# Patient Record
Sex: Female | Born: 1975 | Race: White | Hispanic: No | Marital: Married | Smoking: Never smoker
Health system: Southern US, Community
[De-identification: ages and names within clinical notes are randomized; demographics above are authoritative.]

## PROBLEM LIST (undated history)

## (undated) DIAGNOSIS — R569 Unspecified convulsions: Secondary | ICD-10-CM

## (undated) HISTORY — DX: Unspecified convulsions: R56.9

## (undated) HISTORY — PX: OTHER SURGICAL HISTORY: SHX169

---

## 2003-06-16 ENCOUNTER — Other Ambulatory Visit: Admission: RE | Admit: 2003-06-16 | Discharge: 2003-06-16 | Payer: Self-pay | Admitting: Obstetrics & Gynecology

## 2004-07-04 ENCOUNTER — Other Ambulatory Visit: Admission: RE | Admit: 2004-07-04 | Discharge: 2004-07-04 | Payer: Self-pay | Admitting: Obstetrics and Gynecology

## 2006-01-04 ENCOUNTER — Other Ambulatory Visit: Admission: RE | Admit: 2006-01-04 | Discharge: 2006-01-04 | Payer: Self-pay | Admitting: Obstetrics and Gynecology

## 2006-07-19 ENCOUNTER — Inpatient Hospital Stay (HOSPITAL_COMMUNITY): Admission: AD | Admit: 2006-07-19 | Discharge: 2006-07-22 | Payer: Self-pay | Admitting: Obstetrics and Gynecology

## 2006-07-27 ENCOUNTER — Inpatient Hospital Stay (HOSPITAL_COMMUNITY): Admission: AD | Admit: 2006-07-27 | Discharge: 2006-07-27 | Payer: Self-pay | Admitting: Obstetrics and Gynecology

## 2006-10-05 ENCOUNTER — Encounter: Admission: RE | Admit: 2006-10-05 | Discharge: 2006-10-05 | Payer: Self-pay | Admitting: Obstetrics and Gynecology

## 2008-06-25 ENCOUNTER — Inpatient Hospital Stay (HOSPITAL_COMMUNITY): Admission: AD | Admit: 2008-06-25 | Discharge: 2008-06-25 | Payer: Self-pay | Admitting: Obstetrics and Gynecology

## 2008-06-29 ENCOUNTER — Inpatient Hospital Stay (HOSPITAL_COMMUNITY): Admission: AD | Admit: 2008-06-29 | Discharge: 2008-06-29 | Payer: Self-pay | Admitting: Obstetrics and Gynecology

## 2008-07-02 ENCOUNTER — Inpatient Hospital Stay (HOSPITAL_COMMUNITY): Admission: AD | Admit: 2008-07-02 | Discharge: 2008-07-03 | Payer: Self-pay | Admitting: Obstetrics and Gynecology

## 2008-07-03 ENCOUNTER — Inpatient Hospital Stay (HOSPITAL_COMMUNITY): Admission: AD | Admit: 2008-07-03 | Discharge: 2008-07-03 | Payer: Self-pay | Admitting: Obstetrics and Gynecology

## 2008-07-07 ENCOUNTER — Inpatient Hospital Stay (HOSPITAL_COMMUNITY): Admission: AD | Admit: 2008-07-07 | Discharge: 2008-07-08 | Payer: Self-pay | Admitting: Obstetrics and Gynecology

## 2008-07-08 ENCOUNTER — Encounter (INDEPENDENT_AMBULATORY_CARE_PROVIDER_SITE_OTHER): Payer: Self-pay | Admitting: Obstetrics and Gynecology

## 2010-04-26 IMAGING — US US OB COMP LESS 14 WK
1 series · 13 of 28 positions shown · non-contrast
Comparison: none

07/08/2008 - DUPLICATE COPY for exam association in [facility] – No change from original report.  The report title and technique for this exam should be:
 OBSTETRICAL ULTRASOUND <14 WKS AND TRANSVAGINAL OB US:
TECHNIQUE: Both transabdominal and transvaginal ultrasound examinations were performed for complete evaluation of the gestation as well as the maternal uterus, adnexal regions, and pelvic cul-de-sac.
CLINICAL DATA: Follow up right ectopic pregnancy. Quantitative
 beta HCG is 55 x 9 and reportedly has increasing. Methotrexate x2.

 TRANSVAGINAL OBSTETRIC US
TECHNIQUE: Transvaginal ultrasound was performed for complete
 evaluation of the gestation as well as the maternal uterus, adnexal
 regions, and pelvic cul-de-sac.

[Series 1: us ob comp less 14 wks · 0.17mm/px · 54 acquisitions, 13 frames shown]
[im 2/54]
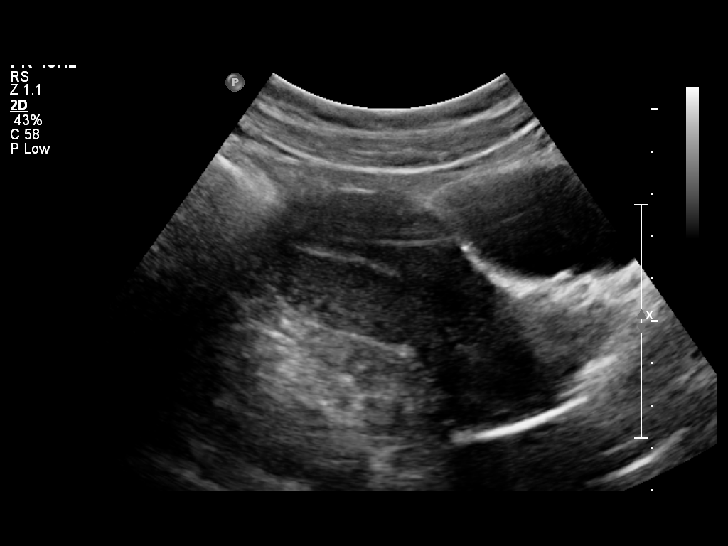
[im 6/54]
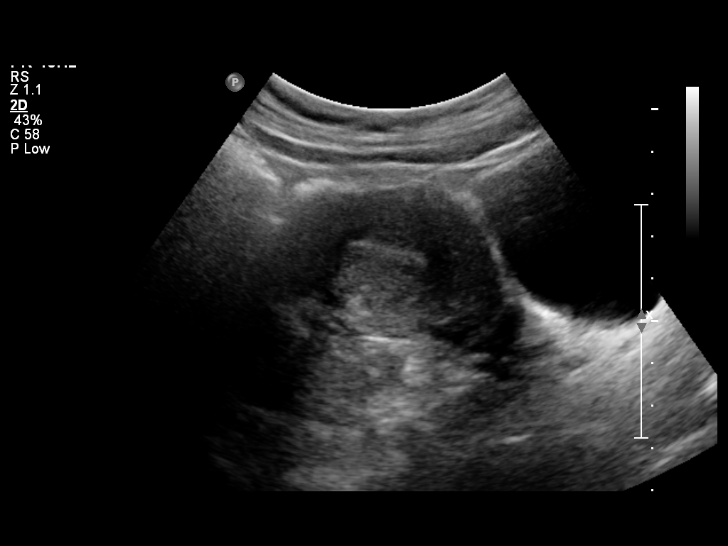
[im 10/54]
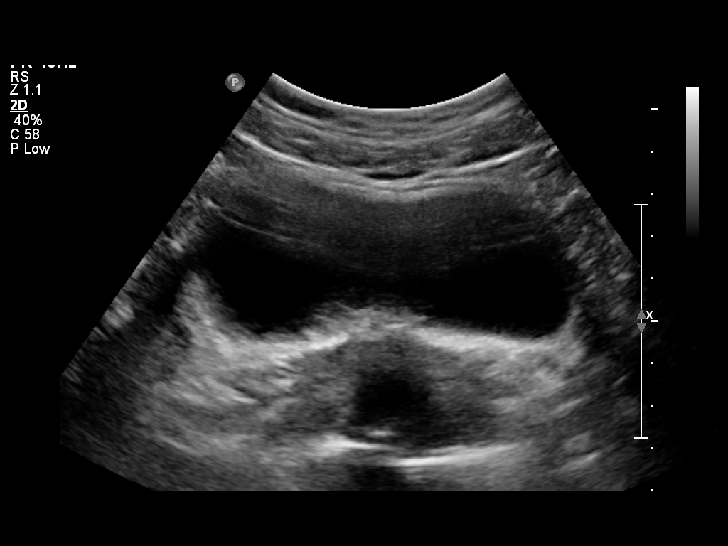
[im 14/54]
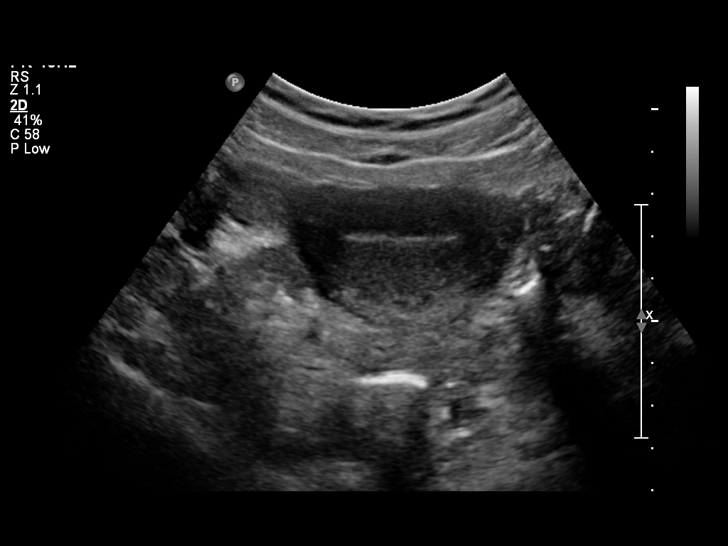
[im 18/54]
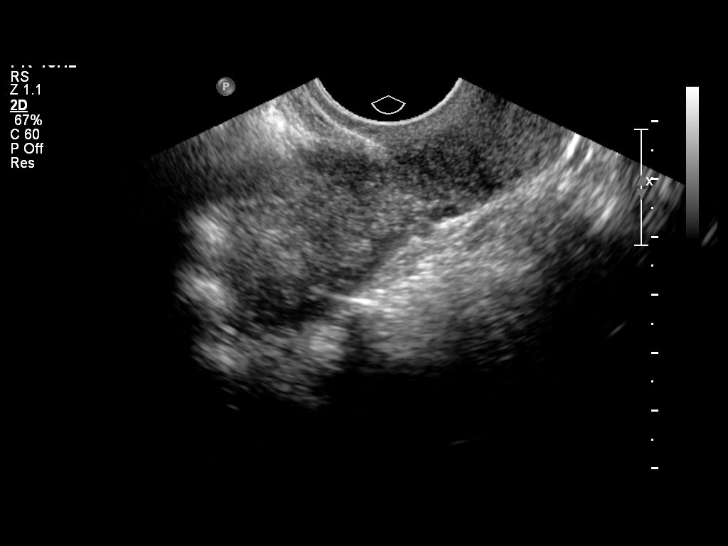
[im 22/54]
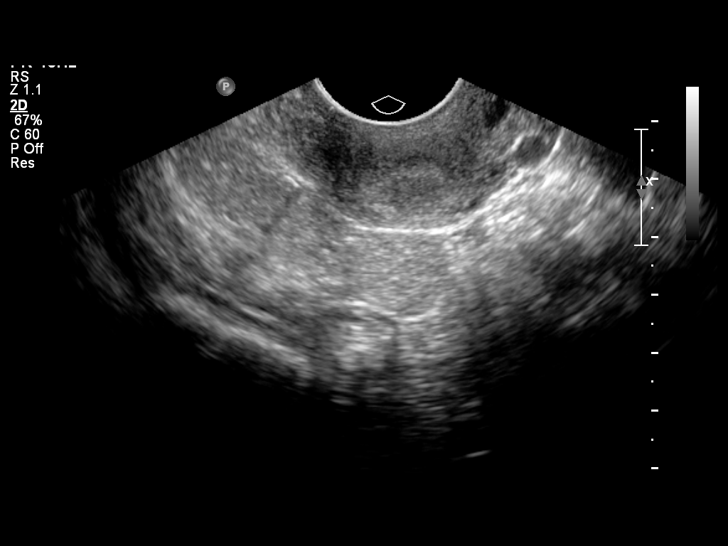
[im 28/54]
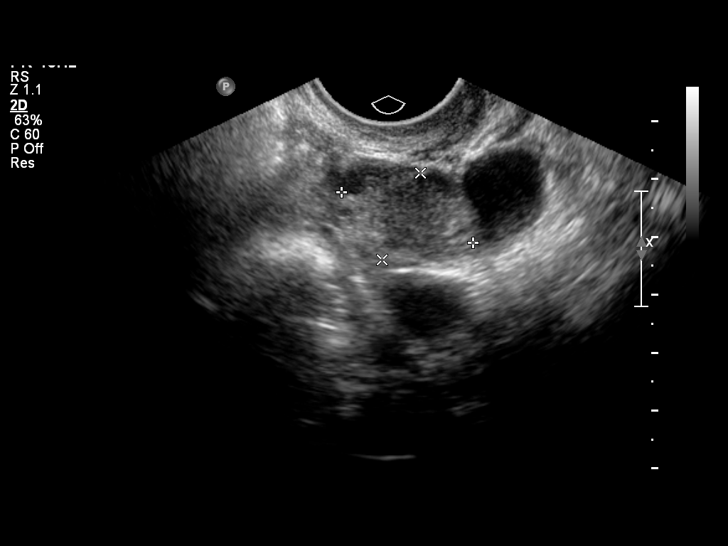
[im 32/54]
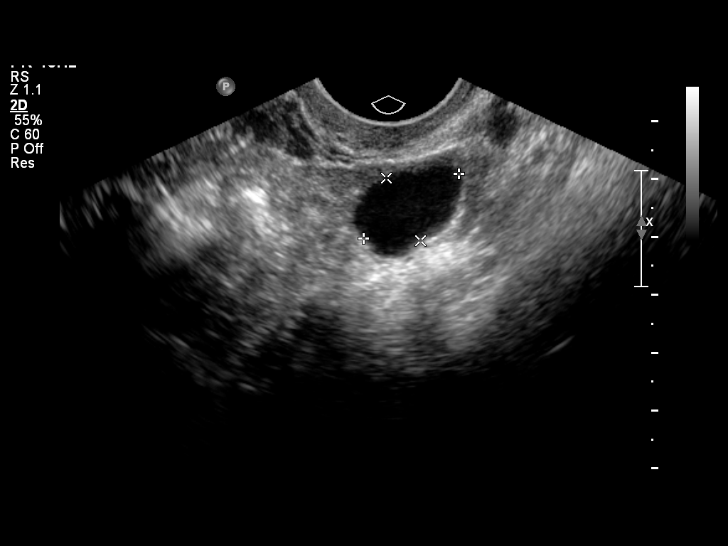
[im 36/54]
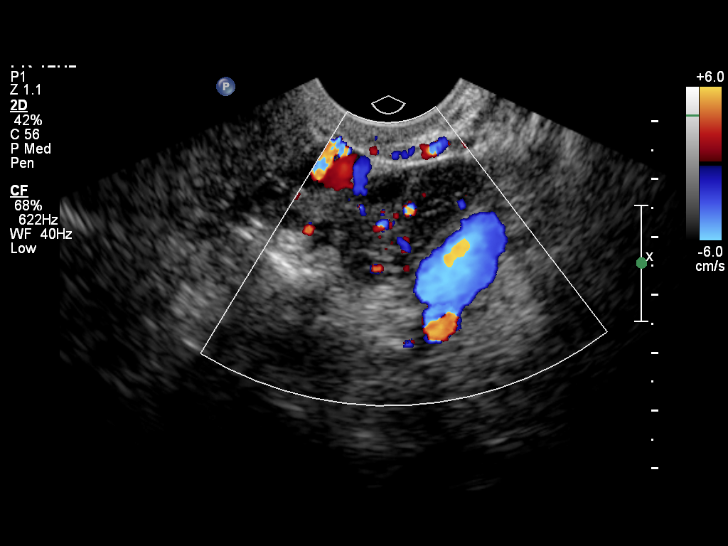
[im 40/54]
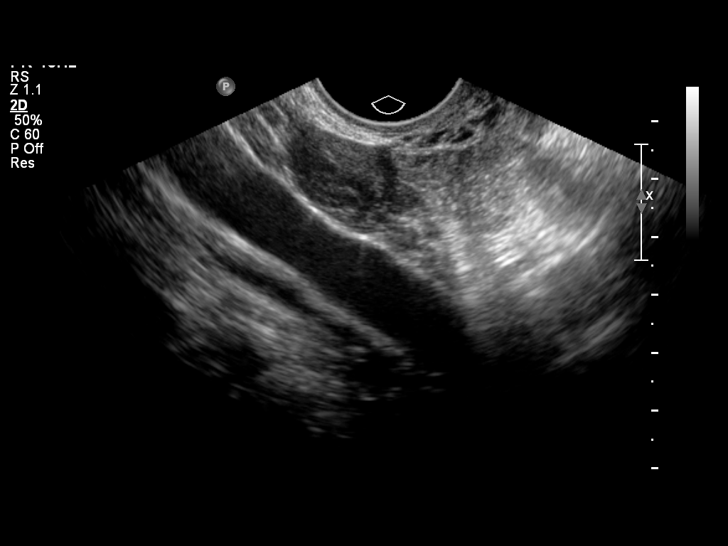
[im 44/54]
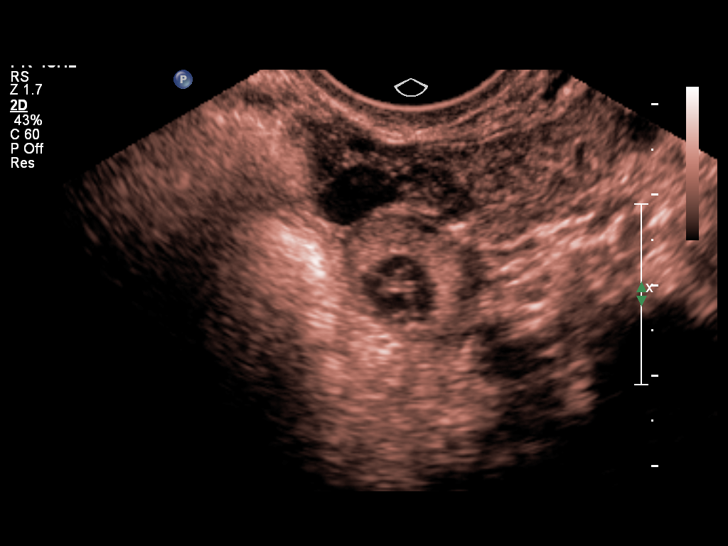
[im 48/54]
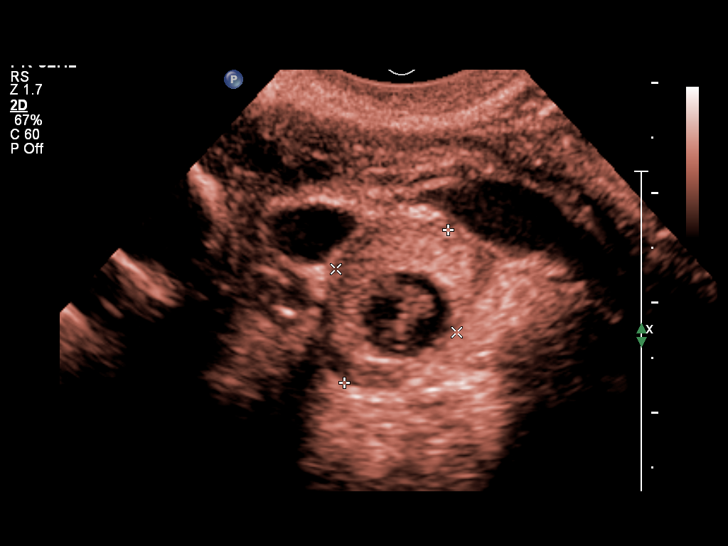
[im 52/54]
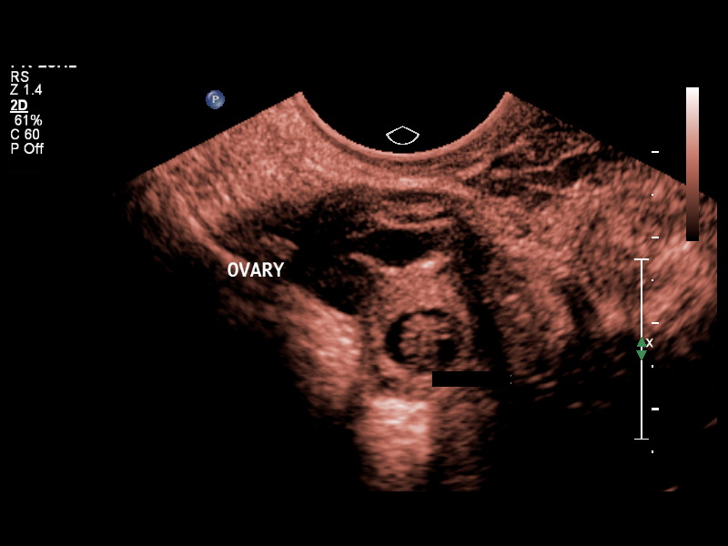

[13 of 28 positions shown; findings below may reference images not displayed]

FINDINGS: There are findings compatible with a right-sided ectopic
 pregnancy. The ectopic is located in the right adnexa, separate
 from the right ovary. There is a gestational sac containing a
 fetal pole and yolk sac. The ectopic measures 1.6 x 1.2 x 1.3 cm.
 Cardiac rate is 140 beats per minute. Crown rump length of the
 fetal pole is 5.9 which corresponds to a 6-week-2-day gestation.
 There is a 1.9 x 1.2 x 1.7 cm cyst adjacent to the left ovary. No
 free pelvic fluid.
IMPRESSION: Living ectopic pregnancy in the right adnexa.

## 2010-07-21 LAB — DIFFERENTIAL
Basophils Absolute: 0 10*3/uL (ref 0.0–0.1)
Basophils Relative: 0 % (ref 0–1)
Eosinophils Absolute: 0.1 10*3/uL (ref 0.0–0.7)
Lymphs Abs: 1.9 10*3/uL (ref 0.7–4.0)
Monocytes Absolute: 0.4 10*3/uL (ref 0.1–1.0)
Monocytes Relative: 7 % (ref 3–12)
Monocytes Relative: 7 % (ref 3–12)
Neutro Abs: 2.7 10*3/uL (ref 1.7–7.7)
Neutro Abs: 5.4 10*3/uL (ref 1.7–7.7)
Neutrophils Relative %: 54 % (ref 43–77)
Neutrophils Relative %: 68 % (ref 43–77)

## 2010-07-21 LAB — CBC
HCT: 37.2 % (ref 36.0–46.0)
HCT: 37.5 % (ref 36.0–46.0)
Hemoglobin: 12.8 g/dL (ref 12.0–15.0)
MCHC: 33.5 g/dL (ref 30.0–36.0)
MCV: 89.9 fL (ref 78.0–100.0)
MCV: 90.4 fL (ref 78.0–100.0)
Platelets: 217 10*3/uL (ref 150–400)
Platelets: 271 10*3/uL (ref 150–400)
RBC: 3.92 MIL/uL (ref 3.87–5.11)
RBC: 4.14 MIL/uL (ref 3.87–5.11)
RDW: 12.2 % (ref 11.5–15.5)
WBC: 5.1 10*3/uL (ref 4.0–10.5)
WBC: 7.8 10*3/uL (ref 4.0–10.5)
WBC: 7.9 10*3/uL (ref 4.0–10.5)

## 2010-07-21 LAB — CREATININE, SERUM
Creatinine, Ser: 0.71 mg/dL (ref 0.4–1.2)
GFR calc Af Amer: >60 mL/min (ref >60)
GFR calc non Af Amer: >60 mL/min (ref >60)

## 2010-07-21 LAB — HCG, QUANTITATIVE, PREGNANCY
hCG, Beta Chain, Quant, S: 3004 m[IU]/mL — ABNORMAL HIGH (ref <5)
hCG, Beta Chain, Quant, S: 3083 m[IU]/mL — ABNORMAL HIGH (ref <5)

## 2010-07-21 LAB — AST: AST: 21 U/L (ref 0–37)

## 2010-08-23 NOTE — Op Note (Signed)
Robin Hickman, DEFRAIN NO.:  [id number]   MEDICAL RECORD NO.:  [id number]          PATIENT TYPE:  INP   LOCATION:  9310                          FACILITY:  [facility]   PHYSICIAN:  Osborn Coho, M.D.   DATE OF BIRTH:  1975/06/30   DATE OF PROCEDURE:  07/08/2008  DATE OF DISCHARGE:                               OPERATIVE REPORT   PREOPERATIVE DIAGNOSIS:  Living right ectopic pregnancy.   POSTOPERATIVE DIAGNOSIS:  Living right ectopic pregnancy.   PROCEDURE:  Laparoscopic partial right salpingectomy.   ANESTHESIA:  General.   ATTENDING PHYSICIAN:  Osborn Coho, MD   FLUIDS:  800 mL.   ESTIMATED BLOOD LOSS:  Minimal.   URINE OUTPUT:  50 mL.   COMPLICATIONS:  None.   SPECIMENS TO PATHOLOGY:  Portion of right fallopian tube with ectopic  pregnancy.   FINDINGS:  Left paratubal cyst near the ampulla with a clear appearance  and right ectopic pregnancy in midportion of fallopian tube.   PROCEDURE:  The patient was taken to the operating room after the risks,  benefits, and alternatives discussed with the patient.  The patient  verbalized understanding and consent signed and witnessed.  The patient  was placed under general anesthesia and prepped and draped in normal  sterile fashion in a dorsal lithotomy position.  A bivalve speculum was  placed in the patient's vagina and the Hulka tenaculum placed for  intrauterine manipulation.  The Foley was placed to gravity.  The  patient was then draped appropriately and attention was then turned to  the umbilicus where a 10-mm incision was made.  The Veress needle was  introduced into the intra-abdominal cavity and pneumoperitoneum  achieved.  Veress needle was removed and 10-mm trocar advanced to the  intra-abdominal cavity.  The laparoscope was introduced and findings as  noted above.  A 10-mm incision was made in the suprapubic region and a 5-  mm incision was made in the left lower quadrant.  Prior to making all  incisions, 0.25% Marcaine was injected at the incision sites.  The  tripolar was then used while tenting up the right fallopian tube and the  ectopic pregnancy was sequentially cauterized and excised until it was  free.  It was placed in an Endopouch through the 10-mm suprapubic port  and removed and sent to Pathology.  There was good hemostasis of the  remaining portion of the right fallopian tube.  The patient was taken  out of Trendelenburg and pneumoperitoneum relieved.  The bilateral  ovaries appeared to be within normal limits.  The 5-mm trocar was  removed prior to removing the pneumoperitoneum and also prior to  removing the pneumoperitoneum the fascia was repaired at the suprapubic  incision with 0-Vicryl via a running stitch.  After pneumoperitoneum was  relieved, the umbilical trocar and laparoscope was removed under direct  visualization and the fascia repaired with 0-Vicryl via a figure-of-  eight stitch.  The skin at the 10-mm incision then repaired with 4-0  Monocryl via a subcuticular stitch.  Dermabond was applied to the  suprapubic incision as well of  the left lower quadrant incision and a  dressing was placed on the umbilical incision.  The Hulka tenaculum was  removed as well as the Foley.  Sponge, lap, and needle count was  correct.  The patient tolerated the procedure well and is currently  awaiting transfer to recovery room in good condition.      Osborn Coho, M.D.  Electronically Signed     AR/MEDQ  D:  07/08/2008  T:  07/08/2008  Job:  [id number]

## 2010-08-26 NOTE — Discharge Summary (Signed)
NAMEAMYRIAH, Robin Hickman NO.:  [id number]   MEDICAL RECORD NO.:  [id number]          PATIENT TYPE:  INP   LOCATION:  9310                          FACILITY:  [facility]   PHYSICIAN:  Osborn Coho, M.D.   DATE OF BIRTH:  08-23-1975   DATE OF ADMISSION:  07/07/2008  DATE OF DISCHARGE:  07/08/2008                               DISCHARGE SUMMARY   ADMISSION DIAGNOSIS:  Living right ectopic pregnancy status post  methotrexate x2.   HOSPITAL COURSE:  [name] is a 35 year old para 1 with a suspected  right ectopic pregnancy who is status post 2 doses of methotrexate who  presented on March 30 for follow up of her second methotrexate shot and  reported vaginal bleeding.  An ultrasound was performed, and a right  living ectopic pregnancy was noted.  There was no free fluid.  However,  options were discussed with the patient, and the patient was agreeable  to proceed with surgical removal of the right ectopic pregnancy.  The  patient was admitted overnight for observation and surgery planned for  early that next morning for a laparoscopic removal of the ectopic  pregnancy.  The morning of July 08, 2008, the patient underwent a  laparoscopic right partial salpingectomy without complication.  The  patient was transferred to the recovery room after the procedure and  discharged from the recovery room as well in stable condition.  The  patient was scheduled to follow up in the office within the next couple  of weeks.  The patient was discharged from the hospital improved and  follow up as mentioned above.      Osborn Coho, M.D.  Electronically Signed     AR/MEDQ  D:  08/20/2008  T:  08/20/2008  Job:  [id number]

## 2010-08-26 NOTE — H&P (Signed)
Robin Hickman, Robin Hickman NO.:  [id number]   MEDICAL RECORD NO.:  [id number]          PATIENT TYPE:  INP   LOCATION:  9168                          [facility]ILITY:  [facility]   PHYSICIAN:  Hal Morales, M.D.DATE OF BIRTH:  07-Apr-1976   DATE OF ADMISSION:  07/19/2006  DATE OF DISCHARGE:                              HISTORY & PHYSICAL   Robin Hickman is a 35 year old gravida 2, para 0-0-1-0 at 37-6/7th weeks',  who presents for induction secondary to oligohydramnios and ultrasound  today, with fluid at less than the third percentile with an AFI of 6.4  cm.  Normal growth was noted with estimated fetal weight of 6 pounds, 6  ounces, to the 39th to 43rd percentile.  She denies any leaking or  bleeding and reports positive fetal movement.   PREGNANCY:  Has been remarkable for:  1. Oligohydramnios noted at 37-4/7th weeks' with normal growth and      normal Dopplers at that time.  2. Positive group B strep.  3. First trimester spotting.   PRENATAL LABS:  Blood type is A positive, Rh antibody negative, VDRL non-  reactive, Rubella titer positive, Hepatitis B surface antigen negative,  HIV was nonreactive.  Cystic fibrosis testing was negative.  GC  chlamydia cultures were negative in December of 2007.  Pap was done in  September.  First trimester screen was normal.  She did have a negative  urine culture in the first trimester.  AFP was normal. GC chlamydia  cultures and group B strep were done at 27 weeks, and all were negative.  Glucola was given and was 147.  She had a three-hour GTT that was  normal.  Group B strep culture was positive at 36 weeks.  EDC of August 03, 2006 was established by last menstrual period and was in agreement  with ultrasound at approximately 6 and 18 weeks.   HISTORY OF PRESENT PREGNANCY:  Patient had carried approximately 9  weeks.  She had first trimester screening that was normal.  She took  Zofran for nausea.  She had a visit for bright red  bleeding at 12 weeks.  There was a slight hemorrhoid noted.  She had a first trimester screen  that was normal.  GC and chlamydia cultures were negative, and urine  culture was negative at that time.  She had another ultrasound at 18  weeks showing normal growth and development.  She had some left-sided  lower abdominal pain at 27 weeks, for which she was evaluated with  cultures for group B strep, all was negative.  She had an elevated  Glucola of 147, three-hour GTT was normal.  Positive group B strep had  been noted in January.  She had an area on her left axilla that felt  more full.  There were no masses noted.  She had an ultrasound done at  37 weeks, when fundal height was lagging slightly behind.  Growth at  that time showed approximately the 39th to 43rd percentile, with  oligohydramnios noted at less than a 6th percentile with normal  Dopplers.  She  was offered induction at that time.  The decision was  made to repeat ultrasound on today, after increasing her fluids.  Oligo  remained today, again with normal growth.  The decision was made to  admit her for induction.   OBSTETRIC HISTORY:  Patient in 2007 had a 5-week miscarriage that did  not require D&C.   MEDICAL HISTORY:  In 2000, she had a left marsupialization of a  Bartholin cyst.  She has occasional UTIs.  She reports she had some  childhood seizures.  SHE IS ALLERGIC TO SHELLFISH.   FAMILY HISTORY:  Father and paternal grandfather have heart disease.  Her father has hypertension.  Her maternal grandfather has diabetes.  Maternal grandfather and paternal grandfather had liver cancer and colon  cancer.  Her father and brother have depression and anxiety.   GENETIC HISTORY:  Unremarkable.   SOCIAL HISTORY:  Patient is married to the father of the baby.  He is  involved and supportive.  His name is Robin Hickman.  Patient is  Caucasian, of the Catholic faith.  She is graduate educated.  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  Her husband has 4 years of college.  He is  in Airline pilot.  She denies any alcohol, drug or tobacco use during this  pregnancy.   PHYSICAL EXAMINATION:  VITAL SIGNS:  Stable.  Patient is afebrile.  HEENT:  Within normal limits.  LUNGS:  Breath sounds are clear.  HEART:  Regular rate and rhythm without murmur.  BREASTS:  Soft and nontender.  ABDOMEN:  Fundal height is approximately 35 cm.  Estimated fetal weight  6 to 6-1/2 pounds.  Uterine contractions are very occasionally mild, at  less than 6 per hour.  Fetal heart rate is reactive with no  decelerations.  PELVIC:  Cervix is fingertip posterior, 75% vertex at a -1 station.  EXTREMITIES:  Deep tendon reflexes are 2+ without clonus.  There is  trace edema noted.   IMPRESSION:  1. Intrauterine pregnancy at 37-6/7th weeks'.  2. Oligohydramnios with reassuring fetal heart tones.   PLAN:  1. Admit to birthing suite for consult with Dr. Pennie Rushing as attending      physician.  2. Routine CNM orders.  3. Cytotec 25 mcg q.4 h. per vagina, then Pitocin in the morning.  4. Group B Strep prophylaxis with onset of Pitocin and augmentation      induction or with the onset of spontaneous labor.  5. Patient plans epidural.      Robin Hickman, C.N.M.      Hal Morales, M.D.  Electronically Signed    VLL/MEDQ  D:  07/19/2006  T:  07/19/2006  Job:  [id number]

## 2011-05-12 ENCOUNTER — Ambulatory Visit: Payer: BC Managed Care – PPO

## 2011-10-31 ENCOUNTER — Other Ambulatory Visit: Payer: Self-pay | Admitting: Dermatology

## 2012-01-24 ENCOUNTER — Ambulatory Visit: Payer: Self-pay | Admitting: Obstetrics and Gynecology

## 2012-03-06 ENCOUNTER — Ambulatory Visit: Payer: Self-pay | Admitting: Obstetrics and Gynecology

## 2012-07-07 ENCOUNTER — Ambulatory Visit (INDEPENDENT_AMBULATORY_CARE_PROVIDER_SITE_OTHER): Payer: BC Managed Care – PPO | Admitting: Physician Assistant

## 2012-07-07 VITALS — BP 91/63 | HR 98 | Temp 100.3°F | Resp 18 | Ht 64.25 in | Wt 121.8 lb

## 2012-07-07 DIAGNOSIS — J020 Streptococcal pharyngitis: Secondary | ICD-10-CM

## 2012-07-07 DIAGNOSIS — J029 Acute pharyngitis, unspecified: Secondary | ICD-10-CM

## 2012-07-07 DIAGNOSIS — R509 Fever, unspecified: Secondary | ICD-10-CM

## 2012-07-07 LAB — POCT CBC
Granulocyte percent: 87.1 %G — AB (ref 37–80)
HCT, POC: 40.7 % (ref 37.7–47.9)
Hemoglobin: 13.3 g/dL (ref 12.2–16.2)
Lymph, poc: 1 (ref 0.6–3.4)
MCH, POC: 29.7 pg (ref 27–31.2)
MCHC: 32.7 g/dL (ref 31.8–35.4)
MCV: 90.8 fL (ref 80–97)
MID (cbc): 0.6 (ref 0–0.9)
MPV: 9 fL (ref 0–99.8)
POC Granulocyte: 10.7 — AB (ref 2–6.9)
POC LYMPH PERCENT: 8.1 %L — AB (ref 10–50)
POC MID %: 4.8 %M (ref 0–12)
Platelet Count, POC: 199 10*3/uL (ref 142–424)
RBC: 4.48 M/uL (ref 4.04–5.48)
RDW, POC: 12.6 %
WBC: 12.3 10*3/uL — AB (ref 4.6–10.2)

## 2012-07-07 LAB — POCT RAPID STREP A (OFFICE): Rapid Strep A Screen: POSITIVE — AB

## 2012-07-07 MED ORDER — FIRST-DUKES MOUTHWASH MT SUSP: 5.0000 mL | OROMUCOSAL | Status: DC | PRN

## 2012-07-07 MED ORDER — AMOXICILLIN 875 MG PO TABS: 875.0000 mg | ORAL_TABLET | Freq: Two times a day (BID) | ORAL | Status: DC

## 2012-07-07 NOTE — Progress Notes (Signed)
Subjective:    Patient ID: Robin Hickman, female    DOB: October 21, 1975, 37 y.o.   MRN: [id number]  HPI 37 year old female presents with acute onset of sore throat, fever, chills, and body aches.  States symptoms started yesterday afternoon and have progressively worsened through the night. History of strep throat in childhood, but has not had recently. Does have a 46 year old daughter at home who she does think has strep throat - will be taking her to pediatrician tomorrow. Denies nasal congestion, rhinorrhea, cough, nausea, vomiting, headache, otalgia, sinus pressure, or dizziness.  Admits to painful swallowing but is able to.   She is otherwise healthy with no other concerns today  Is self-employed as a Scientific laboratory technician.     Review of Systems  Constitutional: Positive for fever and chills.  HENT: Positive for sore throat. Negative for ear pain, congestion, rhinorrhea, trouble swallowing, neck pain and postnasal drip.   Respiratory: Negative for cough and shortness of breath.   Gastrointestinal: Negative for nausea, vomiting and abdominal pain.  Neurological: Negative for dizziness and headaches.       Objective:   Physical Exam  Constitutional: She is oriented to person, place, and time. She appears well-developed and well-nourished.  HENT:  Head: Normocephalic and atraumatic.  Right Ear: Hearing, tympanic membrane, external ear and ear canal normal.  Left Ear: Hearing, tympanic membrane, external ear and ear canal normal.  Mouth/Throat: Uvula is midline and mucous membranes are normal. Oropharyngeal exudate and posterior oropharyngeal erythema present. No posterior oropharyngeal edema or tonsillar abscesses.  Eyes: Conjunctivae are normal.  Neck: Normal range of motion.  Cardiovascular: Normal rate, regular rhythm and normal heart sounds.   Pulmonary/Chest: Effort normal and breath sounds normal.  Lymphadenopathy:    She has cervical adenopathy (+AC).  Neurological: She is alert and  oriented to person, place, and time.  Psychiatric: She has a normal mood and affect. Her behavior is normal. Judgment and thought content normal.     Results for orders placed in visit on 07/07/12  POCT CBC      Result Value Range   WBC 12.3 (*) 4.6 - 10.2 K/uL   Lymph, poc 1.0  0.6 - 3.4   POC LYMPH PERCENT 8.1 (*) 10 - 50 %L   MID (cbc) 0.6  0 - 0.9   POC MID % 4.8  0 - 12 %M   POC Granulocyte 10.7 (*) 2 - 6.9   Granulocyte percent 87.1 (*) 37 - 80 %G   RBC 4.48  4.04 - 5.48 M/uL   Hemoglobin 13.3  12.2 - 16.2 g/dL   HCT, POC 40.9  81.1 - 47.9 %   MCV 90.8  80 - 97 fL   MCH, POC 29.7  27 - 31.2 pg   MCHC 32.7  31.8 - 35.4 g/dL   RDW, POC 91.4     Platelet Count, POC 199  142 - 424 K/uL   MPV 9.0  0 - 99.8 fL  POCT RAPID STREP A (OFFICE)      Result Value Range   Rapid Strep A Screen Positive (*) Negative    Ibuprofen 600 mg given today in office.      Assessment & Plan:  Streptococcal sore throat - Plan: amoxicillin (AMOXIL) 875 MG tablet  Acute pharyngitis - Plan: POCT CBC, POCT rapid strep A, Diphenhyd-Hydrocort-Nystatin (FIRST-DUKES MOUTHWASH) SUSP  Fever, unspecified - Plan: POCT CBC  Amoxicillin 875 mg bid x 10 days Continue ibuprofen 600 mg tid prn  fever, chills, pain Change toothbrush. Increase fluids and rest  Follow up if symptoms worsen or fail to improve.

## 2013-09-19 ENCOUNTER — Ambulatory Visit: Payer: BC Managed Care – PPO | Admitting: Family Medicine

## 2015-04-29 ENCOUNTER — Ambulatory Visit (INDEPENDENT_AMBULATORY_CARE_PROVIDER_SITE_OTHER): Payer: BLUE CROSS/BLUE SHIELD | Admitting: Emergency Medicine

## 2015-04-29 ENCOUNTER — Ambulatory Visit (INDEPENDENT_AMBULATORY_CARE_PROVIDER_SITE_OTHER): Payer: BLUE CROSS/BLUE SHIELD

## 2015-04-29 VITALS — BP 110/74 | HR 116 | Temp 98.0°F | Resp 16 | Ht 64.0 in | Wt 131.4 lb

## 2015-04-29 DIAGNOSIS — R05 Cough: Secondary | ICD-10-CM

## 2015-04-29 DIAGNOSIS — R059 Cough, unspecified: Secondary | ICD-10-CM

## 2015-04-29 DIAGNOSIS — J209 Acute bronchitis, unspecified: Secondary | ICD-10-CM

## 2015-04-29 MED ORDER — HYDROCOD POLST-CPM POLST ER 10-8 MG/5ML PO SUER: 5.0000 mL | Freq: Two times a day (BID) | ORAL | Status: DC

## 2015-04-29 MED ORDER — AZITHROMYCIN 250 MG PO TABS: ORAL_TABLET | ORAL | Status: DC

## 2015-04-29 NOTE — Patient Instructions (Signed)

## 2015-04-29 NOTE — Progress Notes (Signed)
Subjective:  Patient ID: Robin Hickman, female    DOB: [birth year]  Age: 40 y.o. MRN: [id number]  CC: Cough and Sore Throat   HPI [name] presents  patients had an intermittent cough since Christmas. Her cough not productive purulent sputum. She has no wheezing or shortness breath. Has no fever or chills. No stool change rash no nasal congestion postnasal drainage. No sore throat or ear pain. She's had no improvement with over-the-counter medication she's nonsmoker  History [name] has a past medical history of Seizures (HCC).   She has past surgical history that includes etopic pregnancy.   Her  family history is not on file.  She   reports that she has never smoked. She has never used smokeless tobacco. She reports that she does not drink alcohol or use illicit drugs.  Outpatient Prescriptions Prior to Visit  Medication Sig Dispense Refill  . amoxicillin (AMOXIL) 875 MG tablet Take 1 tablet (875 mg total) by mouth 2 (two) times daily. (Patient not taking: Reported on 04/29/2015) 20 tablet 0  . Diphenhyd-Hydrocort-Nystatin (FIRST-DUKES MOUTHWASH) SUSP Use as directed 5-10 mLs in the mouth or throat every 2 (two) hours as needed. Use 1:1 ratio with viscous lidocaine (Patient not taking: Reported on 04/29/2015) 237 mL 0   No facility-administered medications prior to visit.    Social History   Social History  . Marital Status: Married    Spouse Name: N/A  . Number of Children: N/A  . Years of Education: N/A   Social History Main Topics  . Smoking status: Never Smoker   . Smokeless tobacco: Never Used  . Alcohol Use: No     Comment: social  . Drug Use: No  . Sexual Activity: Not Asked   Other Topics Concern  . None   Social History Narrative     Review of Systems  Constitutional: Negative for fever, chills and appetite change.  HENT: Negative for congestion, ear pain, postnasal drip, sinus pressure and sore throat.   Eyes: Negative for pain and redness.    Respiratory: Positive for cough. Negative for shortness of breath and wheezing.   Cardiovascular: Negative for leg swelling.  Gastrointestinal: Negative for nausea, vomiting, abdominal pain, diarrhea, constipation and blood in stool.  Endocrine: Negative for polyuria.  Genitourinary: Negative for dysuria, urgency, frequency and flank pain.  Musculoskeletal: Negative for gait problem.  Skin: Negative for rash.  Neurological: Negative for weakness and headaches.  Psychiatric/Behavioral: Negative for confusion and decreased concentration. The patient is not nervous/anxious.     Objective:  BP 110/74 mmHg  Pulse 116  Temp(Src) 98 F (36.7 C) (Oral)  Resp 16  Ht  (1.626 m)  Wt 131 lb 6.4 oz (59.603 kg)  BMI 22.54 kg/m2  SpO2 98%  LMP 04/26/2015  Physical Exam  Constitutional: She is oriented to person, place, and time. She appears well-developed and well-nourished. No distress.  HENT:  Head: Normocephalic and atraumatic.  Right Ear: External ear normal.  Left Ear: External ear normal.  Nose: Nose normal.  Eyes: Conjunctivae and EOM are normal. Pupils are equal, round, and reactive to light. No scleral icterus.  Neck: Normal range of motion. Neck supple. No tracheal deviation present.  Cardiovascular: Normal rate, regular rhythm and normal heart sounds.   Pulmonary/Chest: Effort normal. No respiratory distress. She has no wheezes. She has no rales.  Abdominal: She exhibits no mass. There is no tenderness. There is no rebound and no guarding.  Musculoskeletal: She exhibits no edema.  Lymphadenopathy:    She has no cervical adenopathy.  Neurological: She is alert and oriented to person, place, and time. Coordination normal.  Skin: Skin is warm and dry. No rash noted.  Psychiatric: She has a normal mood and affect. Her behavior is normal.      Assessment & Plan:   Aily was seen today for cough and sore throat.  Diagnoses and all orders for this visit:  Cough -      DG Chest 2 View; Future  Acute bronchitis, unspecified organism  Other orders -     azithromycin (ZITHROMAX) 250 MG tablet; Take 2 tabs PO x 1 dose, then 1 tab PO QD x 4 days -     chlorpheniramine-HYDROcodone (TUSSIONEX PENNKINETIC ER) 10-8 MG/5ML SUER; Take 5 mLs by mouth 2 (two) times daily.   I am having Ms. Knierim start on azithromycin and chlorpheniramine-HYDROcodone. I am also having her maintain her amoxicillin, FIRST-DUKES MOUTHWASH, Dextromethorphan-Guaifenesin (MUCINEX DM PO), and Ibuprofen.  Meds ordered this encounter  Medications  . Dextromethorphan-Guaifenesin (MUCINEX DM PO)    Sig: Take by mouth daily.  . Ibuprofen (ADVIL) 200 MG CAPS    Sig: Take by mouth.  Marland Kitchen azithromycin (ZITHROMAX) 250 MG tablet    Sig: Take 2 tabs PO x 1 dose, then 1 tab PO QD x 4 days    Dispense:  6 tablet    Refill:  0  . chlorpheniramine-HYDROcodone (TUSSIONEX PENNKINETIC ER) 10-8 MG/5ML SUER    Sig: Take 5 mLs by mouth 2 (two) times daily.    Dispense:  60 mL    Refill:  0    Appropriate red flag conditions were discussed with the patient as well as actions that should be taken.  Patient expressed his understanding.  Follow-up: Return if symptoms worsen or fail to improve.  Carmelina Dane, MD   [facility] reading (PRIMARY) by  Dr. Dareen Piano.  negative.

## 2016-01-18 ENCOUNTER — Encounter: Payer: Self-pay | Admitting: Physician Assistant

## 2016-01-18 DIAGNOSIS — J309 Allergic rhinitis, unspecified: Secondary | ICD-10-CM | POA: Insufficient documentation

## 2016-03-11 ENCOUNTER — Ambulatory Visit (INDEPENDENT_AMBULATORY_CARE_PROVIDER_SITE_OTHER): Payer: BLUE CROSS/BLUE SHIELD | Admitting: Emergency Medicine

## 2016-03-11 VITALS — BP 124/66 | HR 88 | Temp 97.8°F | Resp 16 | Ht 64.0 in | Wt 131.0 lb

## 2016-03-11 DIAGNOSIS — J029 Acute pharyngitis, unspecified: Secondary | ICD-10-CM | POA: Diagnosis not present

## 2016-03-11 LAB — POCT RAPID STREP A (OFFICE): RAPID STREP A SCREEN: NEGATIVE

## 2016-03-11 MED ORDER — AMOXICILLIN 875 MG PO TABS: 875.0000 mg | ORAL_TABLET | Freq: Two times a day (BID) | ORAL | 0 refills | Status: DC

## 2016-03-11 NOTE — Patient Instructions (Addendum)
     IF you received an x-ray today, you will receive an invoice from [facility]. Please contact [facility] at [phone] with questions or concerns regarding your invoice.   IF you received labwork today, you will receive an invoice from [facility]. Please contact [facility] at [phone] with questions or concerns regarding your invoice.   Our billing staff will not be able to assist you with questions regarding bills from these companies.  You will be contacted with the lab results as soon as they are available. The fastest way to get your results is to activate your My Chart account. Instructions are located on the last page of this paperwork. If you have not heard from us regarding the results in 2 weeks, please contact this office.      Sore Throat A sore throat is pain, burning, irritation, or scratchiness in the throat. When you have a sore throat, you may feel pain or tenderness in your throat when you swallow or talk. Many things can cause a sore throat, including:  An infection.  Seasonal allergies.  Dryness in the air.  Irritants, such as smoke or pollution.  Gastroesophageal reflux disease (GERD).  A tumor. A sore throat is often the first sign of another sickness. It may happen with other symptoms, such as coughing, sneezing, fever, and swollen neck glands. Most sore throats go away without medical treatment. Follow these instructions at home:  Take over-the-counter medicines only as told by your health care provider.  Drink enough fluids to keep your urine clear or pale yellow.  Rest as needed.  To help with pain, try:  Sipping warm liquids, such as broth, herbal tea, or warm water.  Eating or drinking cold or frozen liquids, such as frozen ice pops.  Gargling with a salt-water mixture 3-4 times a day or as needed. To make a salt-water mixture, completely dissolve -1 tsp of salt in 1 cup of warm  water.  Sucking on hard candy or throat lozenges.  Putting a cool-mist humidifier in your bedroom at night to moisten the air.  Sitting in the bathroom with the door closed for 5-10 minutes while you run hot water in the shower.  Do not use any tobacco products, such as cigarettes, chewing tobacco, and e-cigarettes. If you need help quitting, ask your health care provider. Contact a health care provider if:  You have a fever for more than 2-3 days.  You have symptoms that last (are persistent) for more than 2-3 days.  Your throat does not get better within 7 days.  You have a fever and your symptoms suddenly get worse. Get help right away if:  You have difficulty breathing.  You cannot swallow fluids, soft foods, or your saliva.  You have increased swelling in your throat or neck.  You have persistent nausea and vomiting. This information is not intended to replace advice given to you by your health care provider. Make sure you discuss any questions you have with your health care provider. Document Released: 05/04/2004 Document Revised: 11/21/2015 Document Reviewed: 01/15/2015 Elsevier Interactive Patient Education  2017 [employer]

## 2016-03-11 NOTE — Progress Notes (Signed)
By signing my name below, I, Raven Small, attest that this documentation has been prepared under the direction and in the presence of Lesle ChrisSteven Daub, MD.  Electronically Signed: Andrew Auaven Small, ED Scribe. 03/11/2016. 10:08 AM.  Chief Complaint:  Chief Complaint  Patient presents with  . Sore Throat    son positive for strip, her Sxs started yesterday, ears hurt    HPI: Robin Hickman is a 40 y.o. female who [facility] complaining of a sore throat onset 1 day. Pt reports her son tested positive for strep yesterday. States she started to develop a sore throat yesterday and has noticed white patches on throat. She notes associated ear pain. She is not on birth control.    Past Medical History:  Diagnosis Date  . Seizures (HCC)    Past Surgical History:  Procedure Laterality Date  . etopic pregnancy     Social History   Social History  . Marital status: Married    Spouse name: N/A  . Number of children: N/A  . Years of education: N/A   Social History Main Topics  . Smoking status: Never Smoker  . Smokeless tobacco: Never Used  . Alcohol use No     Comment: social  . Drug use: No  . Sexual activity: Not Asked   Other Topics Concern  . None   Social History Narrative  . None   History reviewed. No pertinent family history. Allergies  Allergen Reactions  . Shellfish Allergy Anaphylaxis   Prior to Admission medications   Medication Sig Start Date End Date Taking? Authorizing Provider  Ibuprofen (ADVIL) 200 MG CAPS Take by mouth.   Yes Historical Provider, MD  amoxicillin (AMOXIL) 875 MG tablet Take 1 tablet (875 mg total) by mouth 2 (two) times daily. Patient not taking: Reported on 03/11/2016 07/07/12   Nelva NayHeather M Marte, PA-C  azithromycin (ZITHROMAX) 250 MG tablet Take 2 tabs PO x 1 dose, then 1 tab PO QD x 4 days Patient not taking: Reported on 03/11/2016 04/29/15   Carmelina DaneJeffery S Anderson, MD  chlorpheniramine-HYDROcodone [facility](TUSSIONEX PENNKINETIC ER) 10-8 MG/5ML SUER  Take 5 mLs by mouth 2 (two) times daily. Patient not taking: Reported on 03/11/2016 04/29/15   Carmelina DaneJeffery S Anderson, MD  Dextromethorphan-Guaifenesin [facility](MUCINEX DM PO) Take by mouth daily.    Historical Provider, MD  Diphenhyd-Hydrocort-Nystatin (FIRST-DUKES MOUTHWASH) SUSP Use as directed 5-10 mLs in the mouth or throat every 2 (two) hours as needed. Use 1:1 ratio with viscous lidocaine Patient not taking: Reported on 03/11/2016 07/07/12   Nelva NayHeather M Marte, PA-C     ROS: The patient denies fevers, chills, night sweats, unintentional weight loss, chest pain, palpitations, wheezing, dyspnea on exertion, nausea, vomiting, abdominal pain, dysuria, hematuria, melena, numbness, weakness, or tingling.   All other systems have been reviewed and were otherwise negative with the exception of those mentioned in the HPI and as above.    PHYSICAL EXAM: Vitals:   03/11/16 0938  BP: 124/66  Pulse: 88  Resp: 16  Temp: 97.8 F (36.6 C)   Body mass index is 22.49 kg/m.   General: Alert, no acute distress HEENT:  Normocephalic, atraumatic.. Mildly red, more on right compared to the left. . No adenopathy. [facility] Cardiovascular:  Regular rate and rhythm, no rubs murmurs or gallops.  No Carotid bruits, radial pulse intact. No pedal edema.  Respiratory: Clear to auscultation bilaterally.  No wheezes, rales, or rhonchi.  No cyanosis, no use of accessory musculature Abdominal:  No organomegaly, abdomen is soft and non-tender, positive bowel sounds.  No masses. Musculoskeletal: Gait intact. No edema, tenderness Skin: No rashes. Neurologic: Facial musculature symmetric. Psychiatric: Patient acts appropriately throughout our interaction. Lymphatic: No cervical or submandibular lymphadenopathy   LABS: Results for orders placed or performed in visit on 03/11/16  POCT rapid strep A  Result Value Ref Range   Rapid Strep A Screen Negative Negative   ASSESSMENT/PLAN: Strep screen was negative treat  with amoxicillin 875 twice a day pending culture results.I personally performed the services described in this documentation, which was scribed in my presence. The recorded information has been reviewed and is accurate.   Gross sideeffects, risk and benefits, and alternatives of medications d/w patient. Patient is aware that all medications have potential sideeffects and we are unable to predict every sideeffect or drug-drug interaction that may occur.  Lesle ChrisSteven Daub MD 03/11/2016 10:07 AM

## 2016-03-12 LAB — CULTURE, GROUP A STREP: Organism ID, Bacteria: NORMAL

## 2016-07-05 ENCOUNTER — Ambulatory Visit (INDEPENDENT_AMBULATORY_CARE_PROVIDER_SITE_OTHER): Payer: BLUE CROSS/BLUE SHIELD

## 2016-07-05 ENCOUNTER — Ambulatory Visit (INDEPENDENT_AMBULATORY_CARE_PROVIDER_SITE_OTHER): Payer: BLUE CROSS/BLUE SHIELD | Admitting: Physician Assistant

## 2016-07-05 VITALS — BP 117/72 | HR 99 | Temp 98.0°F | Resp 17 | Ht 66.5 in | Wt 131.0 lb

## 2016-07-05 DIAGNOSIS — R0789 Other chest pain: Secondary | ICD-10-CM | POA: Diagnosis not present

## 2016-07-05 DIAGNOSIS — R8299 Other abnormal findings in urine: Secondary | ICD-10-CM | POA: Diagnosis not present

## 2016-07-05 DIAGNOSIS — R82998 Other abnormal findings in urine: Secondary | ICD-10-CM

## 2016-07-05 LAB — POCT URINALYSIS DIP (MANUAL ENTRY)
BILIRUBIN UA: NEGATIVE
GLUCOSE UA: NEGATIVE
Nitrite, UA: NEGATIVE
PH UA: 6 (ref 5.0–8.0)
Protein Ur, POC: NEGATIVE
RBC UA: NEGATIVE
Spec Grav, UA: 1.02 (ref 1.030–1.035)
Urobilinogen, UA: 0.2 (ref ?–2.0)

## 2016-07-05 LAB — POCT CBC
GRANULOCYTE PERCENT: 70.8 % (ref 37–80)
HEMATOCRIT: 37.8 % (ref 37.7–47.9)
HEMOGLOBIN: 13.1 g/dL (ref 12.2–16.2)
Lymph, poc: 2 (ref 0.6–3.4)
MCH: 30.7 pg (ref 27–31.2)
MCHC: 34.7 g/dL (ref 31.8–35.4)
MCV: 88.7 fL (ref 80–97)
MID (cbc): 0.5 (ref 0–0.9)
MPV: 7.9 fL (ref 0–99.8)
POC GRANULOCYTE: 5.9 (ref 2–6.9)
POC LYMPH PERCENT: 23.5 %L (ref 10–50)
POC MID %: 5.7 % (ref 0–12)
Platelet Count, POC: 230 10*3/uL (ref 142–424)
RBC: 4.26 M/uL (ref 4.04–5.48)
RDW, POC: 12.5 %
WBC: 8.3 10*3/uL (ref 4.6–10.2)

## 2016-07-05 LAB — POC MICROSCOPIC URINALYSIS (UMFC): MUCUS RE: ABSENT

## 2016-07-05 MED ORDER — MELOXICAM 15 MG PO TABS: 7.5000 mg | ORAL_TABLET | Freq: Every day | ORAL | 0 refills | Status: AC

## 2016-07-05 NOTE — Patient Instructions (Signed)
     IF you received an x-ray today, you will receive an invoice from [facility]. Please contact [facility] at [phone] with questions or concerns regarding your invoice.   IF you received labwork today, you will receive an invoice from [facility]. Please contact [facility] at [phone] with questions or concerns regarding your invoice.   Our billing staff will not be able to assist you with questions regarding bills from these companies.  You will be contacted with the lab results as soon as they are available. The fastest way to get your results is to activate your My Chart account. Instructions are located on the last page of this paperwork. If you have not heard from us regarding the results in 2 weeks, please contact this office.     

## 2016-07-05 NOTE — Progress Notes (Signed)
07/06/2016 6:56 PM   DOB: December 28, 1975 / MRN: [id number]  SUBJECTIVE:  Robin Hickman is a 41 y.o. female presenting for pain under in the ribs under the left breast that started 12 days ago.  Tells me it is an achy feeling and she can push on the area and reproduce the pain.  Coughing, deep breathing, sneezing makes the pain worse. She did have two colds in February which were mild but did have some cough. She has allergies but no history of asthma. She has a history of infertility and it treated with progesterone PO. She has tried advil 800 mg q12 with some relief. Denies history of DM2, HTN.   She is allergic to shellfish allergy.   She  has a past medical history of Seizures ([place]).    She  reports that she has never smoked. She has never used smokeless tobacco. She reports that she does not drink alcohol or use drugs. She  reports that she does not engage in sexual activity. The patient  has a past surgical history that includes etopic pregnancy.  Her family history is not on file.  Review of Systems  Constitutional: Negative for diaphoresis.  Respiratory: Negative for cough, hemoptysis, sputum production, shortness of breath and wheezing.   Cardiovascular: Negative for chest pain, orthopnea and leg swelling.  Gastrointestinal: Negative for nausea.  Neurological: Negative for dizziness.    The problem list and medications were reviewed and updated by myself where necessary and exist elsewhere in the encounter.   OBJECTIVE:  BP 117/72   Pulse 99   Temp 98 F (36.7 C) (Oral)   Resp 17   Ht 5' 6.5" (1.689 m)   Wt 131 lb (59.4 kg)   LMP 06/19/2016 (Approximate)   SpO2 97%   BMI 20.83 kg/m   Pulse Readings from Last 3 Encounters:  07/05/16 99  03/11/16 88  04/29/15 (!) 116     Physical Exam  Constitutional:  Non-toxic appearance.  Cardiovascular: Normal rate, regular rhythm, S1 normal and S2 normal.  Exam reveals no gallop, no friction rub and no decreased pulses.   No  murmur heard. Pulmonary/Chest: Effort normal. No stridor. No respiratory distress. She has no wheezes. She has no rales.  Abdominal: She exhibits no distension.  Musculoskeletal: She exhibits no edema.  Skin: Skin is warm and dry. She is not diaphoretic. No pallor.    Results for orders placed or performed in visit on 07/05/16 (from the past 72 hour(s))  CMP14+EGFR     Status: Abnormal   Collection Time: 07/05/16  2:29 PM  Result Value Ref Range   Glucose 130 (H) 65 - 99 mg/dL   BUN 15 6 - 24 mg/dL   Creatinine, Ser 0.86 0.57 - 1.00 mg/dL   GFR calc non Af Amer 84 >59 mL/min/1.73   GFR calc Af Amer 96 >59 mL/min/1.73   BUN/Creatinine Ratio 17 9 - 23   Sodium 140 134 - 144 mmol/L   Potassium 3.8 3.5 - 5.2 mmol/L   Chloride 99 96 - 106 mmol/L   CO2 25 18 - 29 mmol/L   Calcium 9.1 8.7 - 10.2 mg/dL   Total Protein 6.5 6.0 - 8.5 g/dL   Albumin 4.2 3.5 - 5.5 g/dL   Globulin, Total 2.3 1.5 - 4.5 g/dL   Albumin/Globulin Ratio 1.8 1.2 - 2.2   Bilirubin Total 0.2 0.0 - 1.2 mg/dL   Alkaline Phosphatase 63 39 - 117 IU/L   AST 18 0 - 40 IU/L  ALT 13 0 - 32 IU/L  POCT CBC     Status: None   Collection Time: 07/05/16  2:43 PM  Result Value Ref Range   WBC 8.3 4.6 - 10.2 K/uL   Lymph, poc 2.0 0.6 - 3.4   POC LYMPH PERCENT 23.5 10 - 50 %L   MID (cbc) 0.5 0 - 0.9   POC MID % 5.7 0 - 12 %M   POC Granulocyte 5.9 2 - 6.9   Granulocyte percent 70.8 37 - 80 %G   RBC 4.26 4.04 - 5.48 M/uL   Hemoglobin 13.1 12.2 - 16.2 g/dL   HCT, POC 37.8 37.7 - 47.9 %   MCV 88.7 80 - 97 fL   MCH, POC 30.7 27 - 31.2 pg   MCHC 34.7 31.8 - 35.4 g/dL   RDW, POC 12.5 %   Platelet Count, POC 230 142 - 424 K/uL   MPV 7.9 0 - 99.8 fL  D-dimer, quantitative (not at [facility])     Status: None   Collection Time: 07/05/16  3:02 PM  Result Value Ref Range   D-DIMER <0.20 0.00 - 0.49 mg/L FEU    Comment: According to the assay manufacturer's published package insert, a normal (<0.50 mg/L FEU) D-dimer result in  conjunction with a non-high clinical probability assessment, excludes deep vein thrombosis (DVT) and pulmonary embolism (PE) with high sensitivity. D-dimer values increase with age and this can make VTE exclusion of an older population difficult. To address this, the [place], based on best available evidence and recent guidelines, recommends that clinicians use age-adjusted D-dimer thresholds in patients greater than 29 years of age with: a) a low probability of PE who do not meet all Pulmonary Embolism Rule Out Criteria, or b) in those with intermediate probability of PE. The formula for an age-adjusted D-dimer cut-off is "age/100". For example, a 43 year old patient would have an age-adjusted cut-off of 0.60 mg/L FEU and an 43 year old 0.80 mg/L FEU.   POCT urinalysis dipstick     Status: Abnormal   Collection Time: 07/05/16  3:04 PM  Result Value Ref Range   Color, UA yellow yellow   Clarity, UA clear clear   Glucose, UA negative negative   Bilirubin, UA negative negative   Ketones, POC UA trace (5) (A) negative   Spec Grav, UA 1.020 1.030 - 1.035   Blood, UA negative negative   pH, UA 6.0 5.0 - 8.0   Protein Ur, POC negative negative   Urobilinogen, UA 0.2 Negative - 2.0   Nitrite, UA Negative Negative   Leukocytes, UA small (1+) (A) Negative  POCT Microscopic Urinalysis ([facility])     Status: Abnormal   Collection Time: 07/05/16  3:26 PM  Result Value Ref Range   WBC,UR,HPF,POC Moderate (A) None WBC/hpf   RBC,UR,HPF,POC None None RBC/hpf   Bacteria Many (A) None, Too numerous to count   Mucus Absent Absent   Epithelial Cells, UR Per Microscopy Moderate (A) None, Too numerous to count cells/hpf    No results found.  ASSESSMENT AND PLAN:  [name] was seen today for chest pain. [id number]  Diagnoses and all orders for this visit:  Atypical chest pain: Given recent addition of of progesterone I am going to rule out the possibility of blood clot,  otherwise her work up is perfect and it is safe to give this pain time to resolve on its own.  Case reviewed with Dr. Mingo Amber and he is in agreement with  the plan.  -     POCT CBC -     POCT urinalysis dipstick -     CMP14+EGFR -     DG Chest 2 View; Future -     EKG; Future -     D-Dimer    The patient is advised to call or return to clinic if she does not see an improvement in symptoms, or to seek the care of the closest emergency department if she worsens with the above plan.   Philis Fendt, MHS, PA-C [facility] 07/06/2016 6:56 PM

## 2016-07-06 ENCOUNTER — Encounter: Payer: Self-pay | Admitting: Physician Assistant

## 2016-07-06 LAB — CMP14+EGFR
A/G RATIO: 1.8 (ref 1.2–2.2)
ALK PHOS: 63 IU/L (ref 39–117)
ALT: 13 IU/L (ref 0–32)
AST: 18 IU/L (ref 0–40)
Albumin: 4.2 g/dL (ref 3.5–5.5)
BILIRUBIN TOTAL: 0.2 mg/dL (ref 0.0–1.2)
BUN/Creatinine Ratio: 17 (ref 9–23)
BUN: 15 mg/dL (ref 6–24)
CHLORIDE: 99 mmol/L (ref 96–106)
CO2: 25 mmol/L (ref 18–29)
Calcium: 9.1 mg/dL (ref 8.7–10.2)
Creatinine, Ser: 0.86 mg/dL (ref 0.57–1.00)
GFR calc Af Amer: 96 mL/min/{1.73_m2} (ref >59)
GFR calc non Af Amer: 84 mL/min/{1.73_m2} (ref >59)
Globulin, Total: 2.3 g/dL (ref 1.5–4.5)
Glucose: 130 mg/dL — ABNORMAL HIGH (ref 65–99)
POTASSIUM: 3.8 mmol/L (ref 3.5–5.2)
Sodium: 140 mmol/L (ref 134–144)
Total Protein: 6.5 g/dL (ref 6.0–8.5)

## 2016-07-06 LAB — D-DIMER, QUANTITATIVE: D-DIMER: <0.2 mg/L FEU (ref 0.00–0.49)

## 2017-01-11 ENCOUNTER — Ambulatory Visit (INDEPENDENT_AMBULATORY_CARE_PROVIDER_SITE_OTHER): Payer: BLUE CROSS/BLUE SHIELD | Admitting: Physician Assistant

## 2017-01-11 ENCOUNTER — Encounter: Payer: Self-pay | Admitting: Physician Assistant

## 2017-01-11 VITALS — BP 112/70 | HR 111 | Temp 98.3°F | Resp 18 | Ht 66.5 in | Wt 132.0 lb

## 2017-01-11 DIAGNOSIS — Z20818 Contact with and (suspected) exposure to other bacterial communicable diseases: Secondary | ICD-10-CM | POA: Diagnosis not present

## 2017-01-11 DIAGNOSIS — Z23 Encounter for immunization: Secondary | ICD-10-CM

## 2017-01-11 MED ORDER — AMOXICILLIN 500 MG PO CAPS: 500.0000 mg | ORAL_CAPSULE | Freq: Two times a day (BID) | ORAL | 0 refills | Status: AC

## 2017-01-11 NOTE — Progress Notes (Signed)
   Robin Hickman  MRN: [id number] DOB: 11/17/1975  Subjective:  Robin Hickman is a 41 y.o. female seen in office today for a chief complaint of question about strep throat.  Her 43 year old tested postive for strep at the pediatrician office yesterday.   She is leaving for [place] tomorrow. She is asymptomatic but is concerned that she may develop strep once she leaves as she has been eating after her son all weekend.   No other questions or concerns.   Review of Systems  Constitutional: Negative for chills, diaphoresis and fever.  HENT: Negative for congestion and sore throat.   Respiratory: Negative for cough.   Cardiovascular: Negative for chest pain and palpitations.    Patient Active Problem List   Diagnosis Date Noted  . Allergic rhinitis 01/18/2016    Current Outpatient Prescriptions on File Prior to Visit  Medication Sig Dispense Refill  . Ibuprofen (ADVIL) 200 MG CAPS Take by mouth.     No current facility-administered medications on file prior to visit.     Allergies  Allergen Reactions  . Shellfish Allergy Anaphylaxis     Objective:  BP 112/70   Pulse (!) 111   Temp 98.3 F (36.8 C) (Oral)   Resp 18   Ht 5' 6.5" (1.689 m)   Wt 132 lb (59.9 kg)   LMP 12/27/2016   SpO2 98%   BMI 20.99 kg/m   Physical Exam  Constitutional: She is oriented to person, place, and time and well-developed, well-nourished, and in no distress.  HENT:  Head: Normocephalic and atraumatic.  Eyes: Conjunctivae are normal.  Neck: Normal range of motion.  Pulmonary/Chest: Effort normal.  Neurological: She is alert and oriented to person, place, and time. Gait normal.  Skin: Skin is warm and dry.  Psychiatric: Affect normal.  Vitals reviewed.   Assessment and Plan :  1. Exposure to strep throat Will give Rx for amoxicillin for pt to take if she develops any symptoms of strep while in [place]. Given educational material on strep throat. Encouraged to discard abx if she does not  use it. Pt agrees to do so.  - amoxicillin (AMOXIL) 500 MG capsule; Take 1 capsule (500 mg total) by mouth 2 (two) times daily.  Dispense: 20 capsule; Refill: 0  2. Need for influenza vaccination - Flu Vaccine QUAD 36+ mos IM   Benjiman Core PA-C  [facility] 01/11/2017 12:05 PM

## 2017-01-11 NOTE — Patient Instructions (Addendum)
  If you develop any symptoms of strep throat, please start the antibiotic as prescribed. Thank you for letting me participate in your health and well being.   Have a great trip and be safe!   Strep Throat Strep throat is an infection of the throat. It is caused by germs. Strep throat spreads from person to person because of coughing, sneezing, or close contact. Follow these instructions at home: Medicines  Take over-the-counter and prescription medicines only as told by your doctor.  Take your antibiotic medicine as told by your doctor. Do not stop taking the medicine even if you feel better.  Have family members who also have a sore throat or fever go to a doctor. Eating and drinking  Do not share food, drinking cups, or personal items.  Try eating soft foods until your sore throat feels better.  Drink enough fluid to keep your pee (urine) clear or pale yellow. General instructions  Rinse your mouth (gargle) with a salt-water mixture 3-4 times per day or as needed. To make a salt-water mixture, stir -1 tsp of salt into 1 cup of warm water.  Make sure that all people in your house wash their hands well.  Rest.  Stay home from school or work until you have been taking antibiotics for 24 hours.  Keep all follow-up visits as told by your doctor. This is important. Contact a doctor if:  Your neck keeps getting bigger.  You get a rash, cough, or earache.  You cough up thick liquid that is green, yellow-brown, or bloody.  You have pain that does not get better with medicine.  Your problems get worse instead of getting better.  You have a fever. Get help right away if:  You throw up (vomit).  You get a very bad headache.  You neck hurts or it feels stiff.  You have chest pain or you are short of breath.  You have drooling, very bad throat pain, or changes in your voice.  Your neck is swollen or the skin gets red and tender.  Your mouth is dry or you are peeing  less than normal.  You keep feeling more tired or it is hard to wake up.  Your joints are red or they hurt. This information is not intended to replace advice given to you by your health care provider. Make sure you discuss any questions you have with your health care provider. Document Released: 09/13/2007 Document Revised: 11/24/2015 Document Reviewed: 07/20/2014 Elsevier Interactive Patient Education  2018 [employer].    IF you received an x-ray today, you will receive an invoice from [facility]. Please contact [facility] at [phone] with questions or concerns regarding your invoice.   IF you received labwork today, you will receive an invoice from [facility]. Please contact [facility] at [phone] with questions or concerns regarding your invoice.   Our billing staff will not be able to assist you with questions regarding bills from these companies.  You will be contacted with the lab results as soon as they are available. The fastest way to get your results is to activate your My Chart account. Instructions are located on the last page of this paperwork. If you have not heard from [place] regarding the results in 2 weeks, please contact this office.

## 2017-02-15 IMAGING — CR DG CHEST 2V
2 series · 2 of 2 positions shown · non-contrast
Comparison: None.

CLINICAL DATA: Shortness of breath

EXAM:
CHEST  2 VIEW

[PA]
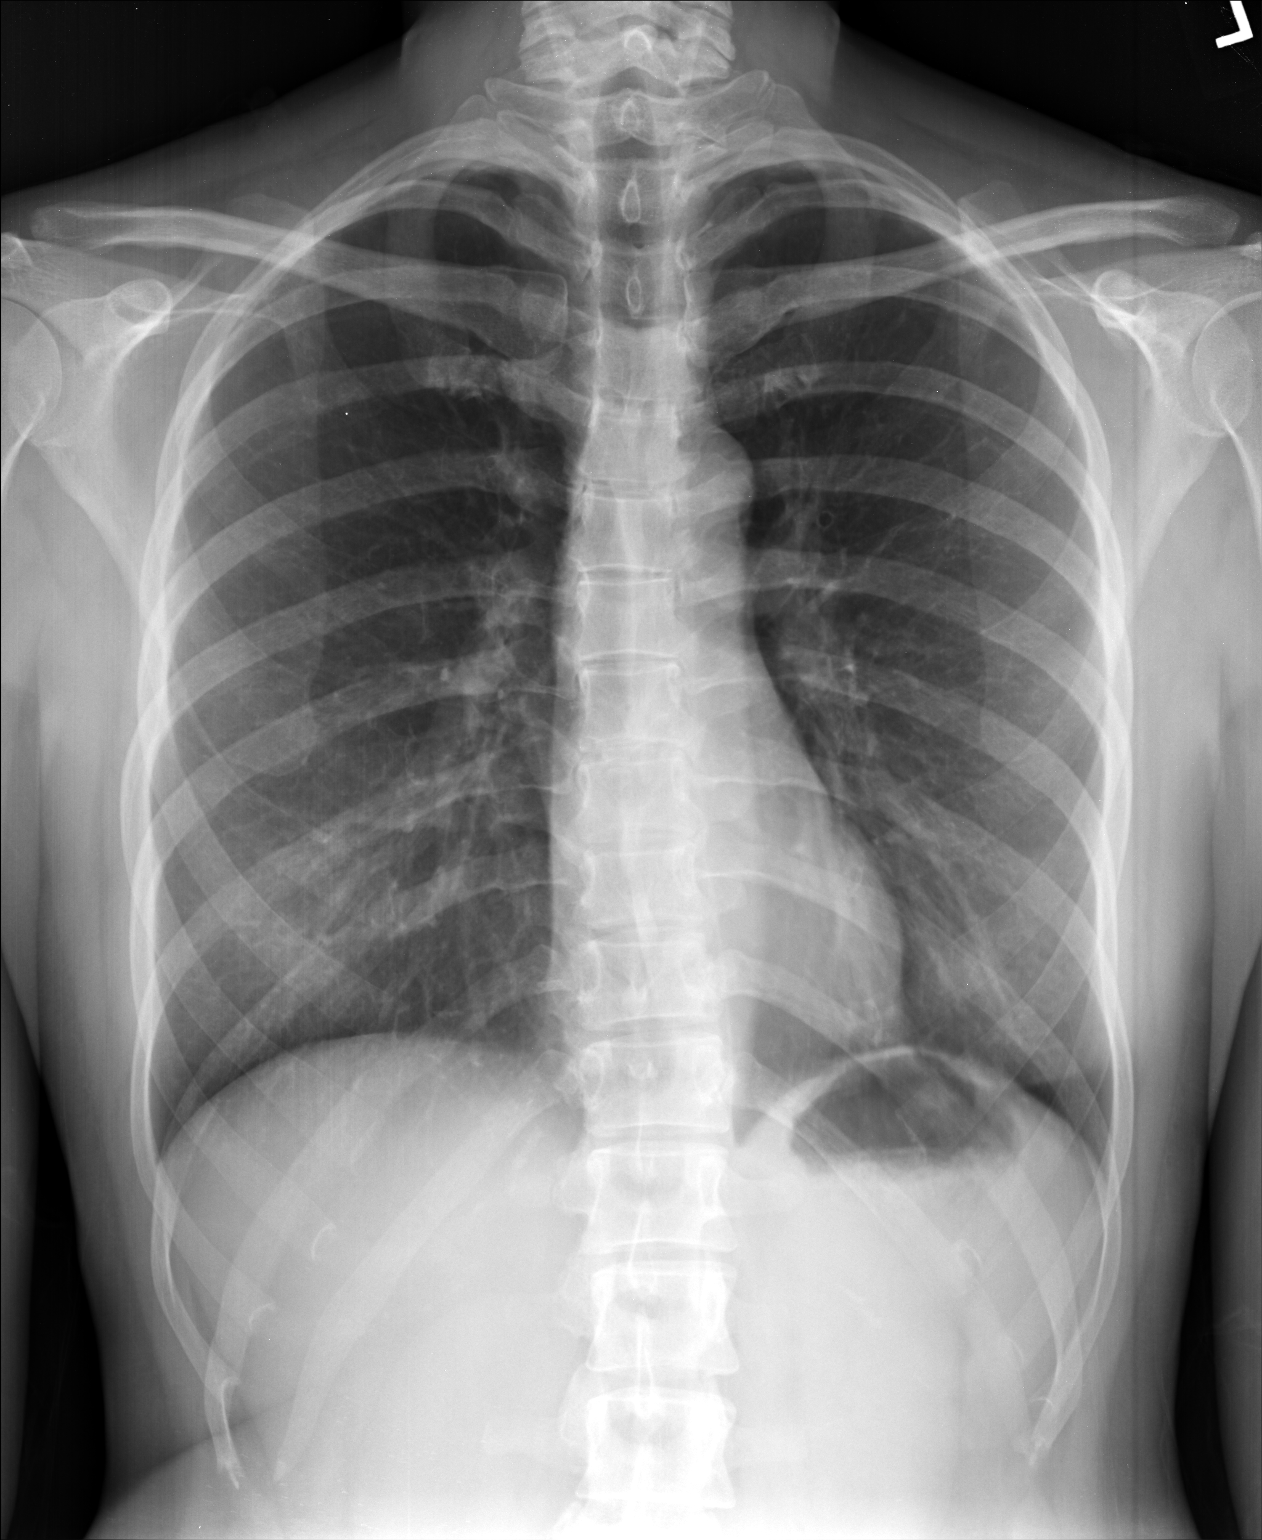

[lateral]
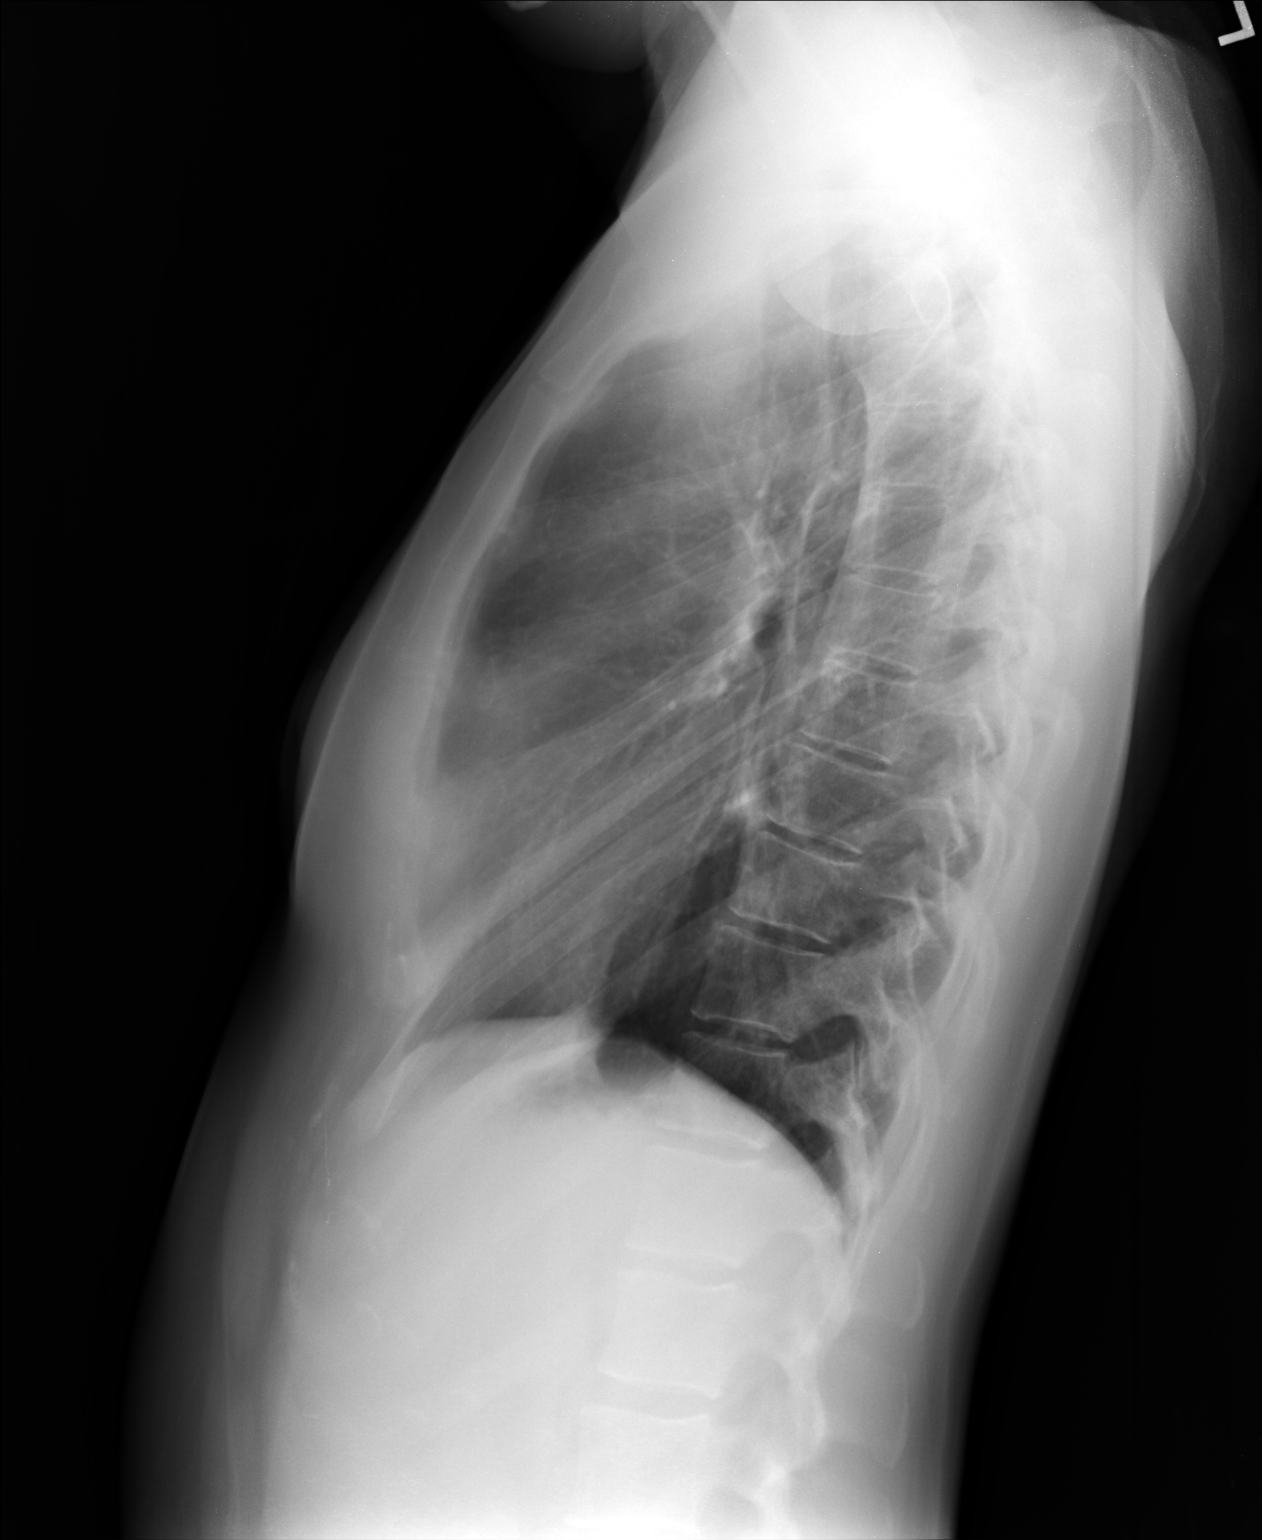

[2 of 2 positions shown; findings below may reference images not displayed]

FINDINGS: The heart size and mediastinal contours are within normal limits.
Both lungs are clear. The visualized skeletal structures are
unremarkable.
IMPRESSION: No active cardiopulmonary disease.

## 2017-05-25 ENCOUNTER — Other Ambulatory Visit: Payer: Self-pay

## 2017-05-25 ENCOUNTER — Encounter: Payer: Self-pay | Admitting: Emergency Medicine

## 2017-05-25 ENCOUNTER — Ambulatory Visit (INDEPENDENT_AMBULATORY_CARE_PROVIDER_SITE_OTHER): Payer: BLUE CROSS/BLUE SHIELD | Admitting: Emergency Medicine

## 2017-05-25 VITALS — BP 123/84 | HR 99 | Temp 98.0°F | Resp 16 | Ht 64.5 in | Wt 139.2 lb

## 2017-05-25 DIAGNOSIS — J988 Other specified respiratory disorders: Secondary | ICD-10-CM

## 2017-05-25 DIAGNOSIS — R059 Cough, unspecified: Secondary | ICD-10-CM | POA: Insufficient documentation

## 2017-05-25 DIAGNOSIS — R05 Cough: Secondary | ICD-10-CM | POA: Diagnosis not present

## 2017-05-25 DIAGNOSIS — B9789 Other viral agents as the cause of diseases classified elsewhere: Secondary | ICD-10-CM | POA: Diagnosis not present

## 2017-05-25 MED ORDER — PROMETHAZINE-CODEINE 6.25-10 MG/5ML PO SYRP: 5.0000 mL | ORAL_SOLUTION | Freq: Every evening | ORAL | 0 refills | Status: AC | PRN

## 2017-05-25 MED ORDER — BENZONATATE 200 MG PO CAPS: 200.0000 mg | ORAL_CAPSULE | Freq: Two times a day (BID) | ORAL | 0 refills | Status: AC | PRN

## 2017-05-25 NOTE — Patient Instructions (Addendum)
     IF you received an x-ray today, you will receive an invoice from [facility]. Please contact [facility] at [phone] with questions or concerns regarding your invoice.   IF you received labwork today, you will receive an invoice from [facility]. Please contact [facility] at [phone] with questions or concerns regarding your invoice.   Our billing staff will not be able to assist you with questions regarding bills from these companies.  You will be contacted with the lab results as soon as they are available. The fastest way to get your results is to activate your My Chart account. Instructions are located on the last page of this paperwork. If you have not heard from us regarding the results in 2 weeks, please contact this office.     Cough, Adult A cough helps to clear your throat and lungs. A cough may last only 2-3 weeks (acute), or it may last longer than 8 weeks (chronic). Many different things can cause a cough. A cough may be a sign of an illness or another medical condition. Follow these instructions at home:  Pay attention to any changes in your cough.  Take medicines only as told by your doctor. ? If you were prescribed an antibiotic medicine, take it as told by your doctor. Do not stop taking it even if you start to feel better. ? Talk with your doctor before you try using a cough medicine.  Drink enough fluid to keep your pee (urine) clear or pale yellow.  If the air is dry, use a cold steam vaporizer or humidifier in your home.  Stay away from things that make you cough at work or at home.  If your cough is worse at night, try using extra pillows to raise your head up higher while you sleep.  Do not smoke, and try not to be around smoke. If you need help quitting, ask your doctor.  Do not have caffeine.  Do not drink alcohol.  Rest as needed. Contact a doctor if:  You have new problems (symptoms).  You cough up yellow fluid  (pus).  Your cough does not get better after 2-3 weeks, or your cough gets worse.  Medicine does not help your cough and you are not sleeping well.  You have pain that gets worse or pain that is not helped with medicine.  You have a fever.  You are losing weight and you do not know why.  You have night sweats. Get help right away if:  You cough up blood.  You have trouble breathing.  Your heartbeat is very fast. This information is not intended to replace advice given to you by your health care provider. Make sure you discuss any questions you have with your health care provider. Document Released: 12/08/2010 Document Revised: 09/02/2015 Document Reviewed: 06/03/2014 Elsevier Interactive Patient Education  2018 [employer] Inc[employer]  

## 2017-05-25 NOTE — Progress Notes (Signed)
Robin Hickman 42 y.o.   Chief Complaint  Patient presents with  . Cough    dry x 2 wks, pt get allergy shots 2 times a week    HISTORY OF PRESENT ILLNESS: This is a 42 y.o. female complaining of dry cough for 2 weeks.  No other significant symptoms.  Cough  This is a new problem. The current episode started 1 to 4 weeks ago. The problem has been waxing and waning. The problem occurs every few minutes. The cough is non-productive. Pertinent negatives include no chest pain, chills, ear congestion, ear pain, fever, headaches, hemoptysis, myalgias, nasal congestion, rash, sore throat, shortness of breath, weight loss or wheezing. She has tried OTC cough suppressant for the symptoms. The treatment provided no relief. There is no history of asthma or COPD.     Prior to Admission medications   Medication Sig Start Date End Date Taking? Authorizing Provider  cetirizine (ZYRTEC) 5 MG tablet Take 5 mg by mouth daily.   Yes [provider]  Ibuprofen (ADVIL) 200 MG CAPS Take by mouth.   Yes [provider]  progesterone (PROMETRIUM) 100 MG capsule Take 100 mg by mouth daily.   Yes [provider]  benzonatate (TESSALON) 200 MG capsule Take 1 capsule (200 mg total) by mouth 2 (two) times daily as needed for cough. 05/25/17   Georgina Quint, MD  promethazine-codeine [facility] WITH CODEINE) 6.25-10 MG/5ML syrup Take 5 mLs by mouth at bedtime as needed for cough. 05/25/17   Georgina Quint, MD    Allergies  Allergen Reactions  . Shellfish Allergy Anaphylaxis    Patient Active Problem List   Diagnosis Date Noted  . Cough 05/25/2017  . Viral respiratory infection 05/25/2017  . Allergic rhinitis 01/18/2016    Past Medical History:  Diagnosis Date  . Seizures (HCC)     Past Surgical History:  Procedure Laterality Date  . etopic pregnancy      Social History   Socioeconomic History  . Marital status: Married    Spouse name: Not on file  .  Number of children: Not on file  . Years of education: Not on file  . Highest education level: Not on file  Social Needs  . Financial resource strain: Not on file  . Food insecurity - worry: Not on file  . Food insecurity - inability: Not on file  . Transportation needs - medical: Not on file  . Transportation needs - non-medical: Not on file  Occupational History  . Not on file  Tobacco Use  . Smoking status: Never Smoker  . Smokeless tobacco: Never Used  Substance and Sexual Activity  . Alcohol use: No    Comment: social  . Drug use: No  . Sexual activity: No  Other Topics Concern  . Not on file  Social History Narrative  . Not on file    History reviewed. No pertinent family history.   Review of Systems  Constitutional: Negative for chills, fever and weight loss.  HENT: Negative.  Negative for ear pain and sore throat.   Eyes: Negative.   Respiratory: Positive for cough. Negative for hemoptysis, shortness of breath and wheezing.   Cardiovascular: Negative for chest pain.  Gastrointestinal: Negative.  Negative for abdominal pain, diarrhea, nausea and vomiting.  Genitourinary: Negative.   Musculoskeletal: Negative for myalgias.  Skin: Negative.  Negative for rash.  Neurological: Negative.  Negative for headaches.  Endo/Heme/Allergies: Negative.   All other systems reviewed and are negative.  Vitals:   05/25/17 1518  BP: 123/84  Pulse: 99  Resp: 16  Temp: 98 F (36.7 C)  SpO2: 96%    Physical Exam  Constitutional: She is oriented to person, place, and time. She appears well-developed and well-nourished.  HENT:  Head: Normocephalic and atraumatic.  Right Ear: External ear normal.  Left Ear: External ear normal.  Nose: Nose normal.  Mouth/Throat: Oropharynx is clear and moist.  Eyes: Conjunctivae and EOM are normal. Pupils are equal, round, and reactive to light.  Neck: Normal range of motion. Neck supple. No JVD present. No thyromegaly present.    Cardiovascular: Normal rate, regular rhythm and normal heart sounds.  Pulmonary/Chest: Effort normal and breath sounds normal. No respiratory distress.  Abdominal: Soft. Bowel sounds are normal. She exhibits no distension. There is no tenderness.  Musculoskeletal: Normal range of motion. She exhibits no edema.  Lymphadenopathy:    She has no cervical adenopathy.  Neurological: She is alert and oriented to person, place, and time. No sensory deficit. She exhibits normal muscle tone.  Skin: Skin is warm and dry. Capillary refill takes less than 2 seconds.  Psychiatric: She has a normal mood and affect. Her behavior is normal.  Vitals reviewed.    ASSESSMENT & PLAN: Robin FanningJulie was seen today for cough.  Diagnoses and all orders for this visit:  Cough -     benzonatate (TESSALON) 200 MG capsule; Take 1 capsule (200 mg total) by mouth 2 (two) times daily as needed for cough. -     promethazine-codeine (PHENERGAN WITH CODEINE) 6.25-10 MG/5ML syrup; Take 5 mLs by mouth at bedtime as needed for cough.  Viral respiratory infection  Other orders -     Cancel: promethazine-codeine (PHENERGAN WITH CODEINE) 6.25-10 MG/5ML syrup; Take 5 mLs by mouth at bedtime as needed for cough.    Patient Instructions       IF you received an x-ray today, you will receive an invoice from [facility]. Please contact [facility] at [phone] with questions or concerns regarding your invoice.   IF you received labwork today, you will receive an invoice from [facility]. Please contact [facility] at [phone] with questions or concerns regarding your invoice.   Our billing staff will not be able to assist you with questions regarding bills from these companies.  You will be contacted with the lab results as soon as they are available. The fastest way to get your results is to activate your My Chart account. Instructions are located on the last page of this paperwork. If you have not heard  from us regarding the results in 2 weeks, please contact this office.     Cough, Adult A cough helps to clear your throat and lungs. A cough may last only 2-3 weeks (acute), or it may last longer than 8 weeks (chronic). Many different things can cause a cough. A cough may be a sign of an illness or another medical condition. Follow these instructions at home:  Pay attention to any changes in your cough.  Take medicines only as told by your doctor. ? If you were prescribed an antibiotic medicine, take it as told by your doctor. Do not stop taking it even if you start to feel better. ? Talk with your doctor before you try using a cough medicine.  Drink enough fluid to keep your pee (urine) clear or pale yellow.  If the air is dry, use a cold steam vaporizer or humidifier in your home.  Stay away from things  that make you cough at work or at home.  If your cough is worse at night, try using extra pillows to raise your head up higher while you sleep.  Do not smoke, and try not to be around smoke. If you need help quitting, ask your doctor.  Do not have caffeine.  Do not drink alcohol.  Rest as needed. Contact a doctor if:  You have new problems (symptoms).  You cough up yellow fluid (pus).  Your cough does not get better after 2-3 weeks, or your cough gets worse.  Medicine does not help your cough and you are not sleeping well.  You have pain that gets worse or pain that is not helped with medicine.  You have a fever.  You are losing weight and you do not know why.  You have night sweats. Get help right away if:  You cough up blood.  You have trouble breathing.  Your heartbeat is very fast. This information is not intended to replace advice given to you by your health care provider. Make sure you discuss any questions you have with your health care provider. Document Released: 12/08/2010 Document Revised: 09/02/2015 Document Reviewed: 06/03/2014 Elsevier Interactive  Patient Education  2018 [employer]      Edwina Barth, MD [facility]

## 2018-06-21 ENCOUNTER — Other Ambulatory Visit: Payer: Self-pay | Admitting: Surgery

## 2023-06-05 ENCOUNTER — Other Ambulatory Visit: Payer: Self-pay | Admitting: Obstetrics and Gynecology

## 2023-06-05 DIAGNOSIS — Z1231 Encounter for screening mammogram for malignant neoplasm of breast: Secondary | ICD-10-CM

## 2023-06-05 DIAGNOSIS — Z23 Encounter for immunization: Secondary | ICD-10-CM

## 2023-06-19 ENCOUNTER — Other Ambulatory Visit: Payer: Self-pay | Admitting: Obstetrics and Gynecology

## 2023-06-19 DIAGNOSIS — Z23 Encounter for immunization: Secondary | ICD-10-CM

## 2023-06-19 DIAGNOSIS — Z1231 Encounter for screening mammogram for malignant neoplasm of breast: Secondary | ICD-10-CM

## 2023-07-11 ENCOUNTER — Ambulatory Visit
Admission: RE | Admit: 2023-07-11 | Discharge: 2023-07-11 | Disposition: A | Discharge status: Home / Self Care | Payer: Self-pay | Source: Ambulatory Visit | Attending: Obstetrics and Gynecology | Admitting: Obstetrics and Gynecology

## 2023-07-11 DIAGNOSIS — Z1231 Encounter for screening mammogram for malignant neoplasm of breast: Secondary | ICD-10-CM
